# Patient Record
Sex: Female | Born: 2000 | Race: Black or African American | Hispanic: No | Marital: Single | Smoking: Current every day smoker
Health system: Southern US, Community
[De-identification: ages and names within clinical notes are randomized; demographics above are authoritative.]

## PROBLEM LIST (undated history)

## (undated) DIAGNOSIS — K219 Gastro-esophageal reflux disease without esophagitis: Secondary | ICD-10-CM

## (undated) HISTORY — PX: NO PAST SURGERIES: SHX2092

---
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
## 2019-07-31 ENCOUNTER — Other Ambulatory Visit: Payer: Self-pay

## 2019-07-31 ENCOUNTER — Encounter (HOSPITAL_COMMUNITY): Payer: Self-pay | Admitting: Emergency Medicine

## 2019-07-31 ENCOUNTER — Emergency Department (HOSPITAL_COMMUNITY): Payer: Self-pay

## 2019-07-31 ENCOUNTER — Emergency Department (HOSPITAL_COMMUNITY)
Admission: EM | Admit: 2019-07-31 | Discharge: 2019-08-02 | Disposition: A | Discharge status: Home / Self Care | Payer: Self-pay | Attending: Emergency Medicine | Admitting: Emergency Medicine

## 2019-07-31 DIAGNOSIS — Z79899 Other long term (current) drug therapy: Secondary | ICD-10-CM | POA: Insufficient documentation

## 2019-07-31 DIAGNOSIS — F4325 Adjustment disorder with mixed disturbance of emotions and conduct: Secondary | ICD-10-CM | POA: Insufficient documentation

## 2019-07-31 DIAGNOSIS — Z23 Encounter for immunization: Secondary | ICD-10-CM | POA: Insufficient documentation

## 2019-07-31 DIAGNOSIS — F323 Major depressive disorder, single episode, severe with psychotic features: Secondary | ICD-10-CM | POA: Insufficient documentation

## 2019-07-31 DIAGNOSIS — M25571 Pain in right ankle and joints of right foot: Secondary | ICD-10-CM | POA: Insufficient documentation

## 2019-07-31 DIAGNOSIS — M79641 Pain in right hand: Secondary | ICD-10-CM | POA: Insufficient documentation

## 2019-07-31 DIAGNOSIS — F17210 Nicotine dependence, cigarettes, uncomplicated: Secondary | ICD-10-CM | POA: Insufficient documentation

## 2019-07-31 DIAGNOSIS — Z20828 Contact with and (suspected) exposure to other viral communicable diseases: Secondary | ICD-10-CM | POA: Insufficient documentation

## 2019-07-31 LAB — RAPID URINE DRUG SCREEN, HOSP PERFORMED
Amphetamines: NOT DETECTED
Barbiturates: NOT DETECTED
Benzodiazepines: NOT DETECTED
Cocaine: NOT DETECTED
Opiates: NOT DETECTED
Tetrahydrocannabinol: POSITIVE — AB

## 2019-07-31 LAB — COMPREHENSIVE METABOLIC PANEL
ALT: 26 U/L (ref 0–44)
AST: 26 U/L (ref 15–41)
Albumin: 4 g/dL (ref 3.5–5.0)
Alkaline Phosphatase: 81 U/L (ref 38–126)
Anion gap: 8 (ref 5–15)
BUN: 8 mg/dL (ref 6–20)
CO2: 25 mmol/L (ref 22–32)
Calcium: 9.2 mg/dL (ref 8.9–10.3)
Chloride: 105 mmol/L (ref 98–111)
Creatinine, Ser: 0.71 mg/dL (ref 0.44–1.00)
GFR calc Af Amer: >60 mL/min (ref >60)
GFR calc non Af Amer: >60 mL/min (ref >60)
Glucose, Bld: 100 mg/dL — ABNORMAL HIGH (ref 70–99)
Potassium: 4.3 mmol/L (ref 3.5–5.1)
Sodium: 138 mmol/L (ref 135–145)
Total Bilirubin: 0.7 mg/dL (ref 0.3–1.2)
Total Protein: 7.8 g/dL (ref 6.5–8.1)

## 2019-07-31 LAB — ETHANOL: Alcohol, Ethyl (B): <10 mg/dL (ref <10)

## 2019-07-31 LAB — CBC
HCT: 41.5 % (ref 36.0–46.0)
Hemoglobin: 13.3 g/dL (ref 12.0–15.0)
MCH: 29.2 pg (ref 26.0–34.0)
MCHC: 32 g/dL (ref 30.0–36.0)
MCV: 91.2 fL (ref 80.0–100.0)
Platelets: 425 10*3/uL — ABNORMAL HIGH (ref 150–400)
RBC: 4.55 MIL/uL (ref 3.87–5.11)
RDW: 15 % (ref 11.5–15.5)
WBC: 5.8 10*3/uL (ref 4.0–10.5)
nRBC: 0 % (ref 0.0–0.2)

## 2019-07-31 LAB — ACETAMINOPHEN LEVEL: Acetaminophen (Tylenol), Serum: <10 ug/mL — ABNORMAL LOW (ref 10–30)

## 2019-07-31 LAB — I-STAT BETA HCG BLOOD, ED (MC, WL, AP ONLY): I-stat hCG, quantitative: <5 m[IU]/mL (ref <5)

## 2019-07-31 LAB — SARS CORONAVIRUS 2 (TAT 6-24 HRS): SARS Coronavirus 2: NEGATIVE

## 2019-07-31 LAB — SALICYLATE LEVEL: Salicylate Lvl: <7 mg/dL (ref 2.8–30.0)

## 2019-07-31 MED ORDER — ALUM & MAG HYDROXIDE-SIMETH 200-200-20 MG/5ML PO SUSP: 30.0000 mL | Freq: Four times a day (QID) | ORAL | Status: DC | PRN

## 2019-07-31 MED ORDER — ONDANSETRON HCL 4 MG PO TABS: 4.0000 mg | ORAL_TABLET | Freq: Three times a day (TID) | ORAL | Status: DC | PRN

## 2019-07-31 MED ORDER — TETANUS-DIPHTH-ACELL PERTUSSIS 5-2.5-18.5 LF-MCG/0.5 IM SUSP
0.5000 mL | Freq: Once | INTRAMUSCULAR | Status: AC
  Administered 2019-07-31: 0.5 mL via INTRAMUSCULAR
  Filled 2019-07-31: qty 0.5

## 2019-07-31 MED ORDER — NICOTINE 21 MG/24HR TD PT24: 21.0000 mg | MEDICATED_PATCH | Freq: Every day | TRANSDERMAL | Status: DC

## 2019-07-31 MED ORDER — ACETAMINOPHEN 325 MG PO TABS: 650.0000 mg | ORAL_TABLET | ORAL | Status: DC | PRN
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
## 2019-07-31 NOTE — BHH Counselor (Signed)
Patient unable to be seen, patient is in a hall bed. TTS will see patient once she's in a room. Clock has been stopped.

## 2019-07-31 NOTE — ED Provider Notes (Signed)
[facility] EMERGENCY DEPARTMENT Provider Note   CSN: [id number] Arrival date & time: 07/31/19  1248     History   Chief Complaint Chief Complaint  Patient presents with  . Suicidal    HPI Nicole Pratt is a very pleasant 18 y.o. female who was previously healthy who presents with suicidal ideation and hallucinations.  Patient reports this has been going on for "awhile," however she has never been seen for it.  Patient describes suicidal thoughts with a plan to jump off a bridge.  Patient also reports homicidal ideation specifically toward 1 female, whom she did not name.  She cut her L arm yesterday.  She has had auditory and visual hallucinations.  She uses marijuana daily and occasionally drinks alcohol.  She reports some right hand pain after punching a wall yesterday.  She also reports right ankle pain after she landed on it wrong playing football last week.  Otherwise, patient denies any other complaints.  Patient is here voluntarily and states she really wants to get help.  Tetanus not up-to-date.     HPI  History reviewed. No pertinent past medical history.  There are no active problems to display for this patient.   History reviewed. No pertinent surgical history.   OB History   No obstetric history on file.      Home Medications    Prior to Admission medications   Medication Sig Start Date End Date Taking? Authorizing Provider  ibuprofen (ADVIL) 200 MG tablet Take 400 mg by mouth every 6 (six) hours as needed for headache (pain).   Yes [provider]    Family History No family history on file.  Social History Social History   Tobacco Use  . Smoking status: Current Every Day Smoker  . Smokeless tobacco: Never Used  Substance Use Topics  . Alcohol use: Yes  . Drug use: Not Currently     Allergies   Patient has no known allergies.   Review of Systems Review of Systems  Constitutional: Negative for chills and  fever.  HENT: Negative for facial swelling and sore throat.   Respiratory: Negative for shortness of breath.   Cardiovascular: Negative for chest pain.  Gastrointestinal: Negative for abdominal pain, nausea and vomiting.  Genitourinary: Negative for dysuria.  Musculoskeletal: Positive for arthralgias. Negative for back pain.  Skin: Positive for wound. Negative for rash.  Neurological: Negative for headaches.  Psychiatric/Behavioral: Positive for hallucinations and suicidal ideas. The patient is not nervous/anxious.      Physical Exam Updated Vital Signs BP 121/77 (BP Location: Right Arm)   Pulse 88   Temp 98.4 F (36.9 C) (Oral)   Resp 16   LMP 07/25/2019   SpO2 100%   Physical Exam Vitals signs and nursing note reviewed.  Constitutional:      General: She is not in acute distress.    Appearance: She is well-developed. She is not diaphoretic.  HENT:     Head: Normocephalic and atraumatic.     Mouth/Throat:     Pharynx: No oropharyngeal exudate.  Eyes:     General: No scleral icterus.       Right eye: No discharge.        Left eye: No discharge.     Conjunctiva/sclera: Conjunctivae normal.     Pupils: Pupils are equal, round, and reactive to light.  Neck:     Musculoskeletal: Normal range of motion and neck supple.     Thyroid: No thyromegaly.  Cardiovascular:  Rate and Rhythm: Normal rate and regular rhythm.     Heart sounds: Normal heart sounds. No murmur. No friction rub. No gallop.   Pulmonary:     Effort: Pulmonary effort is normal. No respiratory distress.     Breath sounds: Normal breath sounds. No stridor. No wheezing or rales.  Abdominal:     General: Bowel sounds are normal. There is no distension.     Palpations: Abdomen is soft.     Tenderness: There is no abdominal tenderness. There is no guarding or rebound.  Musculoskeletal:     Comments: Tenderness over the right fifth metacarpal, mild swelling, full range of motion and can close fist Tenderness  over the right lateral malleolus with some swelling  Lymphadenopathy:     Cervical: No cervical adenopathy.  Skin:    General: Skin is warm and dry.     Coloration: Skin is not pale.     Findings: No rash.     Comments: Multiple superficial lacerations to the left forearm.  On her left forearm as well is written in marker, "I choose to die" in all capital letters  Neurological:     Mental Status: She is alert.     Coordination: Coordination normal.  Psychiatric:        Attention and Perception: She does not perceive auditory or visual hallucinations.        Mood and Affect: Mood is depressed.        Behavior: Behavior normal. Behavior is cooperative.        Thought Content: Thought content includes homicidal and suicidal ideation. Thought content includes suicidal plan.      ED Treatments / Results  Labs (all labs ordered are listed, but only abnormal results are displayed) Labs Reviewed  COMPREHENSIVE METABOLIC PANEL - Abnormal; Notable for the following components:      Result Value   Glucose, Bld 100 (*)    All other components within normal limits  ACETAMINOPHEN LEVEL - Abnormal; Notable for the following components:   Acetaminophen (Tylenol), Serum <10 (*)    All other components within normal limits  CBC - Abnormal; Notable for the following components:   Platelets 425 (*)    All other components within normal limits  ETHANOL  SALICYLATE LEVEL  RAPID URINE DRUG SCREEN, HOSP PERFORMED  I-STAT BETA HCG BLOOD, ED ([facility], [facility], [facility] ONLY)    EKG None  Radiology Dg Ankle Complete Right  Result Date: 07/31/2019 CLINICAL DATA:  Right ankle pain after landing on it wrong while playing football EXAM: RIGHT ANKLE - COMPLETE 3+ VIEW COMPARISON:  None. FINDINGS: Mild diffuse soft tissue swelling. No fracture or dislocation identified. No radio-opaque foreign body or soft tissue calcification. IMPRESSION: Soft tissue swelling.  No acute bone abnormality. Electronically Signed   By:  Kerby Moors M.D.   On: 07/31/2019 15:08   Dg Hand Complete Right  Result Date: 07/31/2019 CLINICAL DATA:  Right hand pain after punching something yesterday. EXAM: RIGHT HAND - COMPLETE 3+ VIEW COMPARISON:  None. FINDINGS: There is no evidence of fracture or dislocation. There is no evidence of arthropathy or other focal bone abnormality. Soft tissues are unremarkable. IMPRESSION: Negative. Electronically Signed   By: Zerita Boers M.D.   On: 07/31/2019 15:05    Procedures Procedures (including critical care time)  Medications Ordered in ED Medications  Tdap (BOOSTRIX) injection 0.5 mL (has no administration in time range)  acetaminophen (TYLENOL) tablet 650 mg (has no administration in time range)  ondansetron (  ZOFRAN) tablet 4 mg (has no administration in time range)  alum & mag hydroxide-simeth (MAALOX/MYLANTA) 200-200-20 MG/5ML suspension 30 mL (has no administration in time range)  nicotine (NICODERM CQ - dosed in mg/24 hours) patch 21 mg (has no administration in time range)     Initial Impression / Assessment and Plan / ED Course  I have reviewed the triage vital signs and the nursing notes.  Pertinent labs & imaging results that were available during my care of the patient were reviewed by me and considered in my medical decision making (see chart for details).        Patient presenting with suicidal, homicidal ideation as well as auditory and visual hallucinations.  Patient has never been evaluated for this.  She has an active plan of suicide to jump off a bridge with water below.  Patient states she does not know how to swim. Labs are negative and x-rays are negative for fracture.  Tetanus updated Patient is medically cleared.  Disposition pending TTS evaluation.  Patient is here voluntarily, but would IVC if patient tried to leave.  She understands this.  Final Clinical Impressions(s) / ED Diagnoses   Final diagnoses:  Suicidal ideation  Hallucinations  Homicidal  ideation  Right hand pain  Acute right ankle pain    ED Discharge Orders    None       Emi HolesLaw, Johnhenry Tippin M, PA-C 07/31/19 1527    Terrilee FilesButler, Michael C, MD 07/31/19 [phone]

## 2019-07-31 NOTE — ED Notes (Signed)
Security at bedside wanding pt 

## 2019-07-31 NOTE — ED Notes (Signed)
Pt eating dinner - Pt voiced understanding of Medical Clearance Pt Policy form - Copy given to pt.

## 2019-07-31 NOTE — ED Notes (Signed)
ALL belongings - inventoried - Building services engineer # 4 - NO Valuables to [employer].

## 2019-07-31 NOTE — ED Notes (Signed)
TTS being performed.  

## 2019-07-31 NOTE — ED Triage Notes (Signed)
Pt states, "I'm here for mental health problems."  Asked pt if she is suicidal and she states, "I was."  Asked if she had a plan and she said, "Yes, but I don't want to talk about it."  Reports auditory and visual hallucinations.  Pt very reluctant to answer any questions.

## 2019-07-31 NOTE — ED Notes (Signed)
Pt changed into burgundy scrubs, labels bagged/labled and placed in LOCKER #4, and security called to wand pt.

## 2019-07-31 NOTE — ED Notes (Signed)
Will need to re-order TTS when pt has been medically cleared - per TTS.

## 2019-08-01 NOTE — ED Notes (Signed)
Sitter leaving at this time. Staffing Office advised no Environmental health practitioner aware.

## 2019-08-01 NOTE — Progress Notes (Signed)
Patient meets criteria for inpatient treatment per Ricky Ala, NP. No appropriate beds at [facility] currently. CSW faxed referrals to the following facilities for review:  [facility]   [facility]   [facility]   [facility]   [facility]    TTS will continue to seek bed placement.     Darletta Moll MSW, [place] Worker Disposition  [facility] Ph: [phone] Fax: [phone] 08/01/2019 11:19 AM

## 2019-08-01 NOTE — BHH Counselor (Signed)
  REASSESSMENT  [place] reevaluated pt for safety and stability.  Pt was observed sitting on the bed, alert, and oriented x 2.  Pt states, "I'm still sad and hearing voices.  I want to get help."  Pt continues to endorse SI/HI and A-hallucinations.  Pt meet inpatient criteria.  TTS will continue to look for inpatient placement.  Alexandera Kuntzman L. [place], [place], [facility], [facility] Therapeutic Triage Specialist  [phone]

## 2019-08-01 NOTE — ED Notes (Signed)
Pt asked politely to use phone. Called sister.

## 2019-08-01 NOTE — ED Notes (Signed)
Pt on phone at nurses' desk - pt noted to be tearful.

## 2019-08-01 NOTE — Progress Notes (Signed)
Patient meets criteria for inpatient treatment per Ricky Ala, NP. No appropriate beds at [facility] currently. CSW faxed referral to the following facility for review:  [facility]   TTS will continue to seek bed placement.   Darletta Moll MSW, [place] Worker Disposition  [facility] Ph: [phone] Fax: [phone]

## 2019-08-01 NOTE — ED Notes (Signed)
No sitter available at this time. Charge RN aware.  

## 2019-08-01 NOTE — ED Notes (Signed)
Pt ambulatory to bathroom then back to room w/o difficulty.  

## 2019-08-01 NOTE — ED Notes (Signed)
Patient denies pain and is resting comfortably.  

## 2019-08-02 ENCOUNTER — Inpatient Hospital Stay (HOSPITAL_COMMUNITY)
Admission: AD | Admit: 2019-08-02 | Payer: Federal, State, Local not specified - Other | Source: Intra-hospital | Admitting: Psychiatry

## 2019-08-02 DIAGNOSIS — F4325 Adjustment disorder with mixed disturbance of emotions and conduct: Secondary | ICD-10-CM | POA: Diagnosis present

## 2019-08-02 NOTE — ED Notes (Signed)
Breakfast ordered 

## 2019-08-02 NOTE — ED Notes (Signed)
Pt eating breakfast and reports "doing ok"

## 2019-08-02 NOTE — Progress Notes (Signed)
Pt accepted to [facility]; bed 304-1  Dr. Dwyane Dee is the accepting provider.    Dr. Parke Poisson is the attending provider.    Call report to [place] @ [facility] ED notified.     Pt is voluntary and will be transported by [facility].   Pt is scheduled to arrive to [facility] at [place], [place], [place] Disposition CSW [facility]HH/TTS [phone]

## 2019-08-02 NOTE — BH Assessment (Signed)
[place] Assessment Progress Note This Probation officer spoke to patient's sister Milagros Reap [phone] with patient's permission to gather collateral. Trenton Gammon states she has no concerns in reference to patient being discharged. Trenton Gammon states to her knowledge patient has never attempted self harm. Patient will be discharged after CSW consult and encouraged to follow up with OP resources provided.
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
## 2019-08-02 NOTE — Discharge Instructions (Signed)
[facility] ° °Mental health service in [place], [place] °COVID-19 info: [email or web address] °Get online care: [email or web address] °Address: [address], [place], [place] [address] °Hours:  °Open ? Closes 7PM °Phone: [phone] °
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
## 2020-12-05 IMAGING — CR DG ANKLE COMPLETE 3+V*R*
3 series · 3 of 3 positions shown · non-contrast
Comparison: None.

CLINICAL DATA: Right ankle pain after landing on it wrong while
playing football

EXAM:
RIGHT ANKLE - COMPLETE 3+ VIEW

[ankle ap]
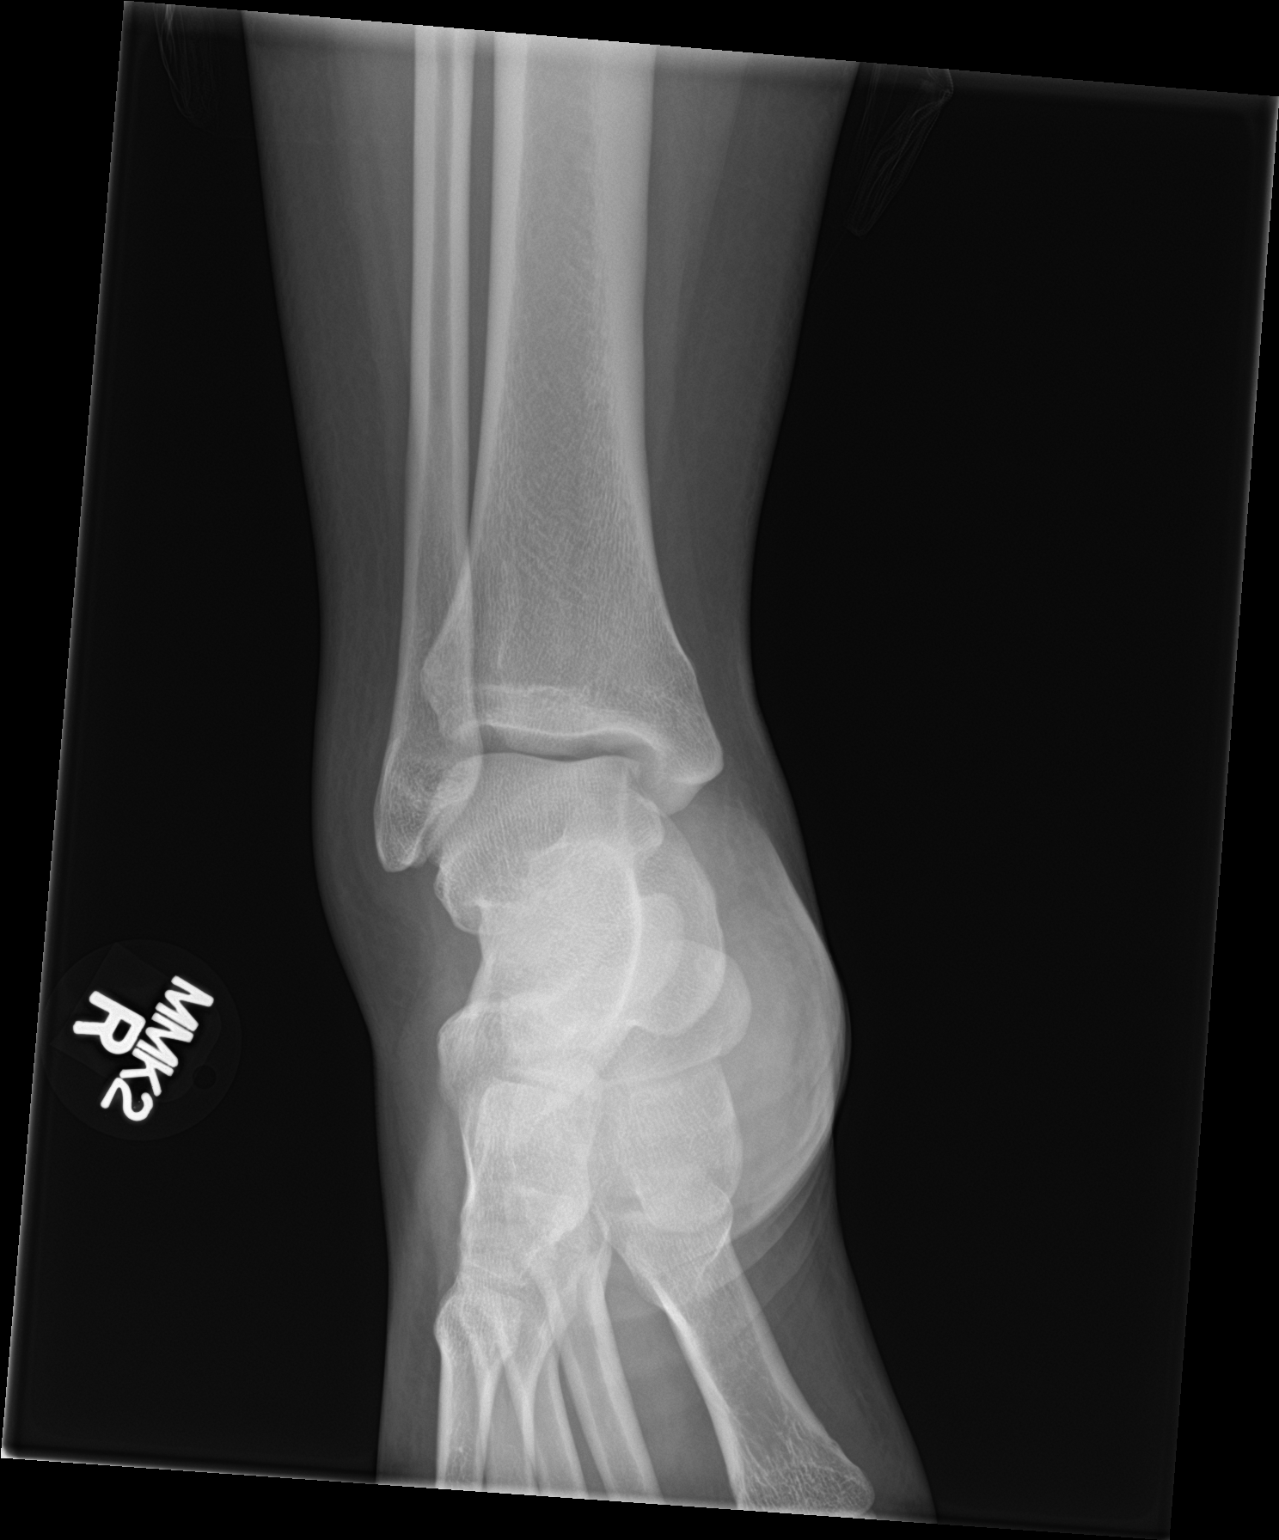

[ankle obl]
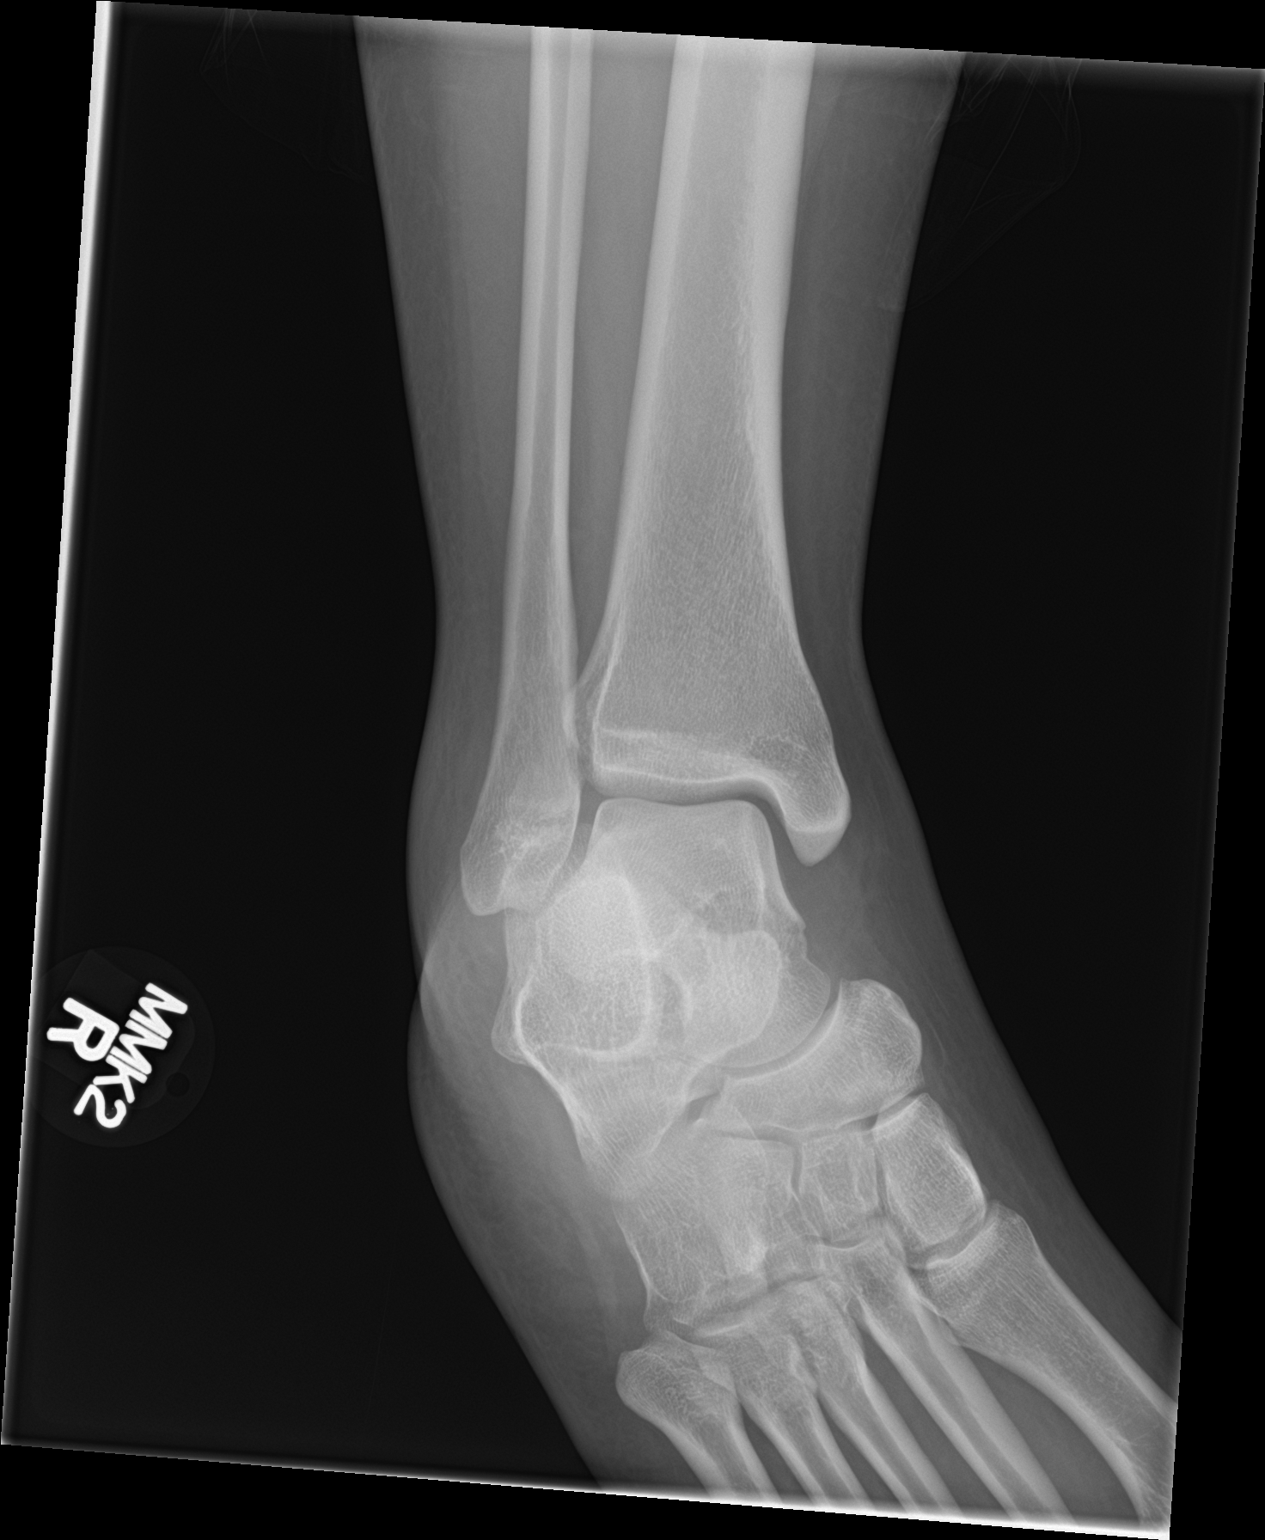

[ankle lat]
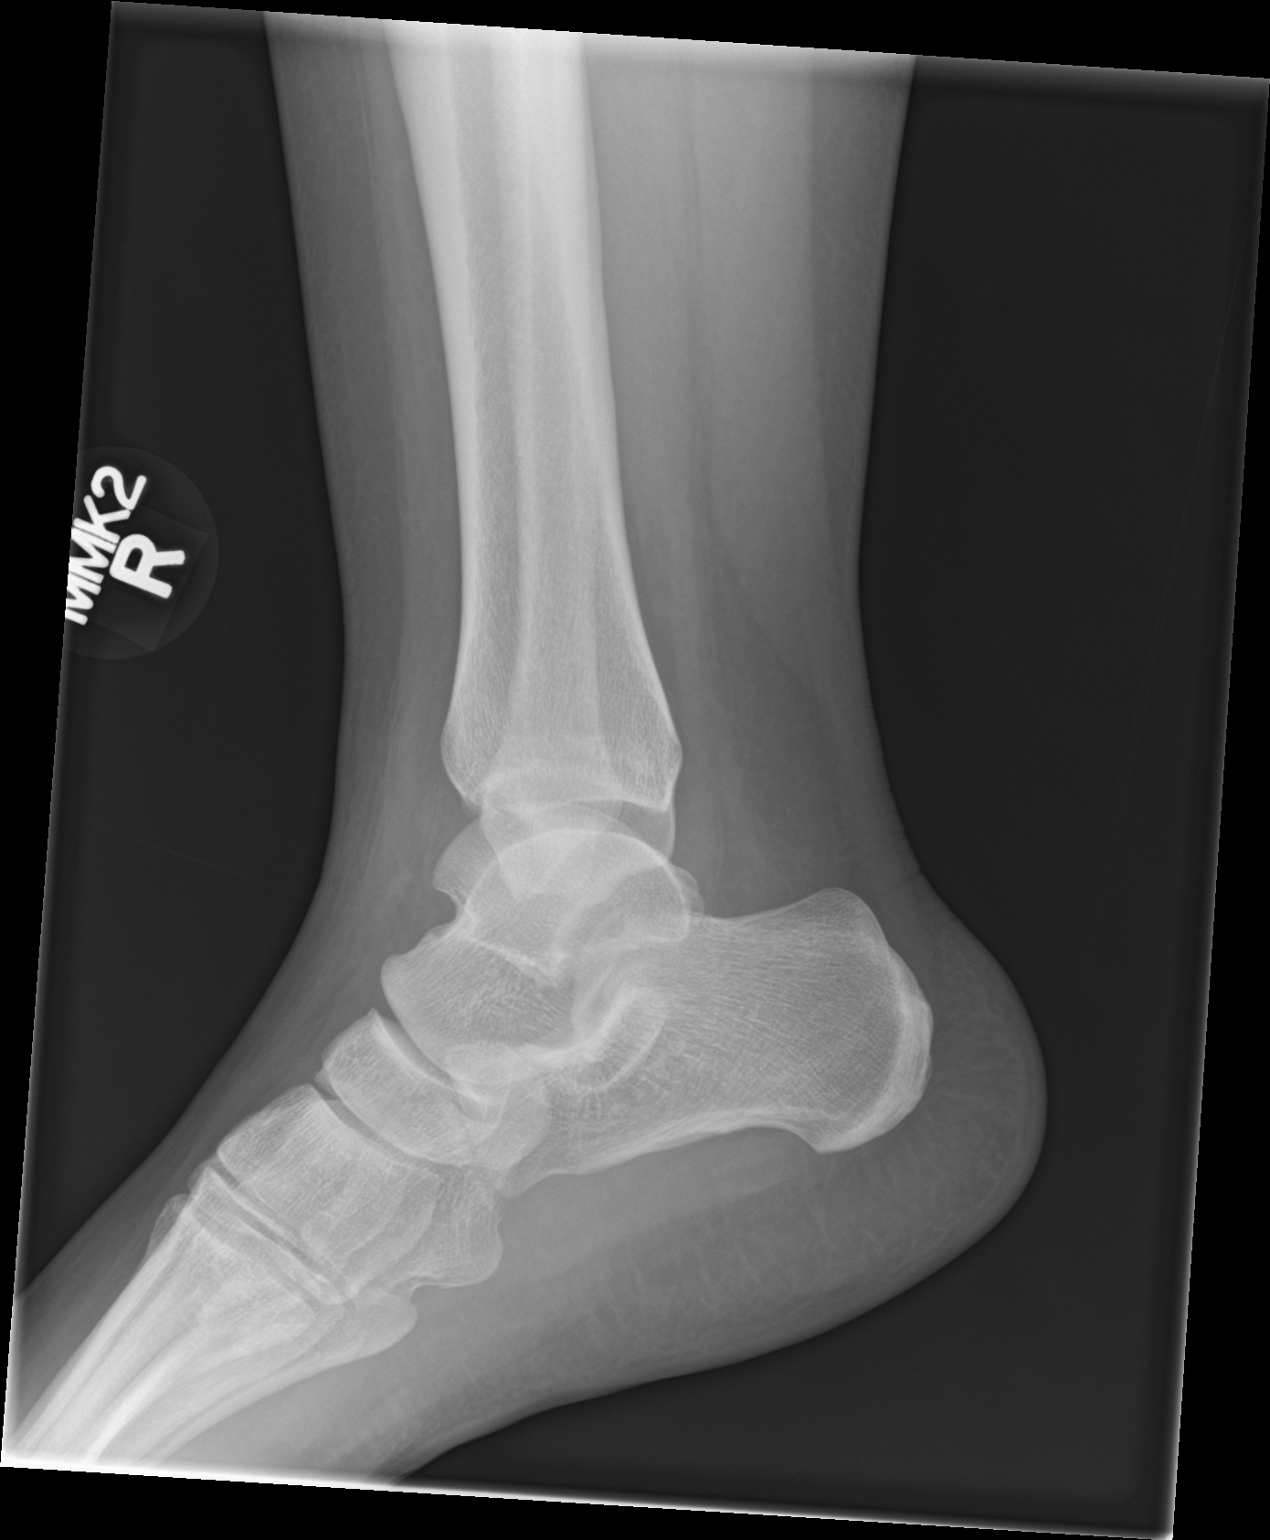

[3 of 3 positions shown; findings below may reference images not displayed]

FINDINGS: Mild diffuse soft tissue swelling. No fracture or dislocation
identified. No radio-opaque foreign body or soft tissue
calcification.
IMPRESSION: Soft tissue swelling.  No acute bone abnormality.

## 2023-02-01 ENCOUNTER — Other Ambulatory Visit: Payer: Self-pay

## 2023-02-01 ENCOUNTER — Emergency Department (HOSPITAL_COMMUNITY): Payer: Self-pay | Admitting: Anesthesiology

## 2023-02-01 ENCOUNTER — Encounter (HOSPITAL_COMMUNITY)
Admission: EM | Disposition: A | Discharge status: Home / Self Care | Payer: Self-pay | Source: Home / Self Care | Attending: Emergency Medicine

## 2023-02-01 ENCOUNTER — Emergency Department (HOSPITAL_COMMUNITY): Payer: Self-pay

## 2023-02-01 ENCOUNTER — Encounter (HOSPITAL_COMMUNITY): Payer: Self-pay | Admitting: Orthopedic Surgery

## 2023-02-01 ENCOUNTER — Ambulatory Visit (HOSPITAL_COMMUNITY)
Admission: EM | Admit: 2023-02-01 | Discharge: 2023-02-01 | Disposition: A | Discharge status: Home / Self Care | Payer: Self-pay | Attending: Emergency Medicine | Admitting: Emergency Medicine

## 2023-02-01 ENCOUNTER — Emergency Department (HOSPITAL_BASED_OUTPATIENT_CLINIC_OR_DEPARTMENT_OTHER): Payer: Self-pay | Admitting: Anesthesiology

## 2023-02-01 DIAGNOSIS — F17210 Nicotine dependence, cigarettes, uncomplicated: Secondary | ICD-10-CM

## 2023-02-01 DIAGNOSIS — S02652A Fracture of angle of left mandible, initial encounter for closed fracture: Secondary | ICD-10-CM

## 2023-02-01 DIAGNOSIS — Y990 Civilian activity done for income or pay: Secondary | ICD-10-CM | POA: Insufficient documentation

## 2023-02-01 DIAGNOSIS — S02602A Fracture of unspecified part of body of left mandible, initial encounter for closed fracture: Secondary | ICD-10-CM

## 2023-02-01 DIAGNOSIS — S0269XA Fracture of mandible of other specified site, initial encounter for closed fracture: Secondary | ICD-10-CM | POA: Insufficient documentation

## 2023-02-01 HISTORY — DX: Gastro-esophageal reflux disease without esophagitis: K21.9

## 2023-02-01 HISTORY — PX: ORIF MANDIBULAR FRACTURE: SHX2127

## 2023-02-01 LAB — CBC
HCT: 40.7 % (ref 36.0–46.0)
Hemoglobin: 13.4 g/dL (ref 12.0–15.0)
MCH: 29.4 pg (ref 26.0–34.0)
MCHC: 32.9 g/dL (ref 30.0–36.0)
MCV: 89.3 fL (ref 80.0–100.0)
Platelets: 405 10*3/uL — ABNORMAL HIGH (ref 150–400)
RBC: 4.56 MIL/uL (ref 3.87–5.11)
RDW: 15.5 % (ref 11.5–15.5)
WBC: 11.3 10*3/uL — ABNORMAL HIGH (ref 4.0–10.5)
nRBC: 0 % (ref 0.0–0.2)

## 2023-02-01 LAB — BASIC METABOLIC PANEL
Anion gap: 10 (ref 5–15)
BUN: 8 mg/dL (ref 6–20)
CO2: 22 mmol/L (ref 22–32)
Calcium: 8.7 mg/dL — ABNORMAL LOW (ref 8.9–10.3)
Chloride: 106 mmol/L (ref 98–111)
Creatinine, Ser: 0.61 mg/dL (ref 0.44–1.00)
GFR, Estimated: >60 mL/min (ref >60)
Glucose, Bld: 111 mg/dL — ABNORMAL HIGH (ref 70–99)
Potassium: 3.8 mmol/L (ref 3.5–5.1)
Sodium: 138 mmol/L (ref 135–145)

## 2023-02-01 LAB — POCT PREGNANCY, URINE: Preg Test, Ur: NEGATIVE

## 2023-02-01 SURGERY — OPEN REDUCTION INTERNAL FIXATION (ORIF) MANDIBULAR FRACTURE
Anesthesia: General | Site: Mouth

## 2023-02-01 MED ORDER — FENTANYL CITRATE (PF) 250 MCG/5ML IJ SOLN
INTRAMUSCULAR | Status: AC
  Filled 2023-02-01: qty 5

## 2023-02-01 MED ORDER — DEXAMETHASONE SODIUM PHOSPHATE 10 MG/ML IJ SOLN
INTRAMUSCULAR | Status: DC | PRN
  Administered 2023-02-01: 10 mg via INTRAVENOUS

## 2023-02-01 MED ORDER — ORAL CARE MOUTH RINSE: 15.0000 mL | Freq: Once | OROMUCOSAL | Status: DC

## 2023-02-01 MED ORDER — ACETAMINOPHEN 160 MG/5ML PO SOLN: 325.0000 mg | ORAL | Status: DC | PRN

## 2023-02-01 MED ORDER — OXYCODONE HCL 5 MG PO TABS: 5.0000 mg | ORAL_TABLET | Freq: Once | ORAL | Status: DC | PRN

## 2023-02-01 MED ORDER — ROCURONIUM BROMIDE 10 MG/ML (PF) SYRINGE
PREFILLED_SYRINGE | INTRAVENOUS | Status: DC | PRN
  Administered 2023-02-01: 70 mg via INTRAVENOUS

## 2023-02-01 MED ORDER — FENTANYL CITRATE (PF) 100 MCG/2ML IJ SOLN
25.0000 ug | INTRAMUSCULAR | Status: DC | PRN
  Administered 2023-02-01: 50 ug via INTRAVENOUS

## 2023-02-01 MED ORDER — MORPHINE SULFATE (PF) 4 MG/ML IV SOLN
4.0000 mg | Freq: Once | INTRAVENOUS | Status: AC
  Administered 2023-02-01: 4 mg via INTRAVENOUS
  Filled 2023-02-01: qty 1

## 2023-02-01 MED ORDER — FENTANYL CITRATE (PF) 250 MCG/5ML IJ SOLN
INTRAMUSCULAR | Status: DC | PRN
  Administered 2023-02-01: 25 ug via INTRAVENOUS
  Administered 2023-02-01: 75 ug via INTRAVENOUS

## 2023-02-01 MED ORDER — SUGAMMADEX SODIUM 200 MG/2ML IV SOLN
INTRAVENOUS | Status: DC | PRN
  Administered 2023-02-01: 200 mg via INTRAVENOUS

## 2023-02-01 MED ORDER — DEXMEDETOMIDINE HCL IN NACL 80 MCG/20ML IV SOLN
INTRAVENOUS | Status: DC | PRN
  Administered 2023-02-01 (×2): 10 ug via INTRAVENOUS

## 2023-02-01 MED ORDER — ACETAMINOPHEN 325 MG PO TABS: 325.0000 mg | ORAL_TABLET | ORAL | Status: DC | PRN

## 2023-02-01 MED ORDER — ONDANSETRON HCL 4 MG/2ML IJ SOLN
INTRAMUSCULAR | Status: AC
  Filled 2023-02-01: qty 2

## 2023-02-01 MED ORDER — MIDAZOLAM HCL 2 MG/2ML IJ SOLN
INTRAMUSCULAR | Status: DC | PRN
  Administered 2023-02-01: 2 mg via INTRAVENOUS

## 2023-02-01 MED ORDER — KETOROLAC TROMETHAMINE 30 MG/ML IJ SOLN
INTRAMUSCULAR | Status: DC | PRN
  Administered 2023-02-01: 30 mg via INTRAVENOUS

## 2023-02-01 MED ORDER — 0.9 % SODIUM CHLORIDE (POUR BTL) OPTIME
TOPICAL | Status: DC | PRN
  Administered 2023-02-01: 1000 mL

## 2023-02-01 MED ORDER — LIDOCAINE 2% (20 MG/ML) 5 ML SYRINGE
INTRAMUSCULAR | Status: AC
  Filled 2023-02-01: qty 5

## 2023-02-01 MED ORDER — CHLORHEXIDINE GLUCONATE 0.12 % MT SOLN: 15.0000 mL | Freq: Once | OROMUCOSAL | Status: DC

## 2023-02-01 MED ORDER — AMISULPRIDE (ANTIEMETIC) 5 MG/2ML IV SOLN: 10.0000 mg | Freq: Once | INTRAVENOUS | Status: DC | PRN

## 2023-02-01 MED ORDER — FENTANYL CITRATE (PF) 100 MCG/2ML IJ SOLN
INTRAMUSCULAR | Status: AC
  Filled 2023-02-01: qty 2

## 2023-02-01 MED ORDER — ACETAMINOPHEN 10 MG/ML IV SOLN
INTRAVENOUS | Status: AC
  Filled 2023-02-01: qty 100

## 2023-02-01 MED ORDER — LIDOCAINE-EPINEPHRINE 1 %-1:100000 IJ SOLN
INTRAMUSCULAR | Status: AC
  Filled 2023-02-01: qty 1

## 2023-02-01 MED ORDER — LACTATED RINGERS IV SOLN: INTRAVENOUS | Status: DC

## 2023-02-01 MED ORDER — ACETAMINOPHEN 10 MG/ML IV SOLN: 1000.0000 mg | Freq: Once | INTRAVENOUS | Status: DC | PRN

## 2023-02-01 MED ORDER — CEPHALEXIN 500 MG PO CAPS: 500.0000 mg | ORAL_CAPSULE | Freq: Three times a day (TID) | ORAL | 0 refills | Status: AC

## 2023-02-01 MED ORDER — OXYCODONE HCL 5 MG/5ML PO SOLN: 5.0000 mg | Freq: Once | ORAL | Status: DC | PRN

## 2023-02-01 MED ORDER — DEXAMETHASONE SODIUM PHOSPHATE 10 MG/ML IJ SOLN
INTRAMUSCULAR | Status: AC
  Filled 2023-02-01: qty 1

## 2023-02-01 MED ORDER — PROPOFOL 10 MG/ML IV BOLUS
INTRAVENOUS | Status: DC | PRN
  Administered 2023-02-01: 150 mg via INTRAVENOUS

## 2023-02-01 MED ORDER — ACETAMINOPHEN 10 MG/ML IV SOLN
INTRAVENOUS | Status: DC | PRN
  Administered 2023-02-01: 1000 mg via INTRAVENOUS

## 2023-02-01 MED ORDER — MIDAZOLAM HCL 2 MG/2ML IJ SOLN
INTRAMUSCULAR | Status: AC
  Filled 2023-02-01: qty 2

## 2023-02-01 MED ORDER — OXYMETAZOLINE HCL 0.05 % NA SOLN
NASAL | Status: DC | PRN
  Administered 2023-02-01: 2 via NASAL

## 2023-02-01 MED ORDER — PROMETHAZINE HCL 25 MG/ML IJ SOLN: 6.2500 mg | INTRAMUSCULAR | Status: DC | PRN

## 2023-02-01 MED ORDER — ONDANSETRON HCL 4 MG/2ML IJ SOLN
INTRAMUSCULAR | Status: DC | PRN
  Administered 2023-02-01: 4 mg via INTRAVENOUS

## 2023-02-01 MED ORDER — LIDOCAINE 2% (20 MG/ML) 5 ML SYRINGE
INTRAMUSCULAR | Status: DC | PRN
  Administered 2023-02-01: 40 mg via INTRAVENOUS

## 2023-02-01 MED ORDER — ROCURONIUM BROMIDE 10 MG/ML (PF) SYRINGE
PREFILLED_SYRINGE | INTRAVENOUS | Status: AC
  Filled 2023-02-01: qty 10

## 2023-02-01 MED ORDER — HYDROCODONE-ACETAMINOPHEN 7.5-325 MG/15ML PO SOLN: 15.0000 mL | Freq: Four times a day (QID) | ORAL | 0 refills | Status: DC | PRN

## 2023-02-01 SURGICAL SUPPLY — 44 items
BAG COUNTER SPONGE SURGICOUNT (BAG) ×1 IMPLANT
BAR FIX PREFORMED OMNIMAX (Miscellaneous) IMPLANT
BLADE MANDIBULAR SCREWDRIVER (BLADE) IMPLANT
BLADE SURG 10 STRL SS (BLADE) IMPLANT
BLADE SURG 15 STRL LF DISP TIS (BLADE) IMPLANT
BLADE SURG 15 STRL SS (BLADE) ×1
CANISTER SUCT 3000ML PPV (MISCELLANEOUS) ×1 IMPLANT
CLEANER TIP ELECTROSURG 2X2 (MISCELLANEOUS) ×1 IMPLANT
COVER SURGICAL LIGHT HANDLE (MISCELLANEOUS) ×2 IMPLANT
DRAPE HALF SHEET 40X57 (DRAPES) IMPLANT
DRIVER SURG ZDRIVE HIGH TORQUE (ORTHOPEDIC DISPOSABLE SUPPLIES) IMPLANT
ELECT COATED BLADE 2.86 ST (ELECTRODE) IMPLANT
ELECT NDL BLADE 2-5/6 (NEEDLE) IMPLANT
ELECT NEEDLE BLADE 2-5/6 (NEEDLE) IMPLANT
ELECT REM PT RETURN 9FT ADLT (ELECTROSURGICAL) ×1
ELECTRODE REM PT RTRN 9FT ADLT (ELECTROSURGICAL) ×1 IMPLANT
GLOVE ECLIPSE 7.5 STRL STRAW (GLOVE) ×1 IMPLANT
GOWN STRL REUS W/ TWL LRG LVL3 (GOWN DISPOSABLE) ×2 IMPLANT
GOWN STRL REUS W/TWL LRG LVL3 (GOWN DISPOSABLE) ×2
KIT BASIN OR (CUSTOM PROCEDURE TRAY) ×1 IMPLANT
KIT TURNOVER KIT B (KITS) ×1 IMPLANT
NDL HYPO 25GX1X1/2 BEV (NEEDLE) IMPLANT
NEEDLE HYPO 25GX1X1/2 BEV (NEEDLE) ×1 IMPLANT
NS IRRIG 1000ML POUR BTL (IV SOLUTION) ×1 IMPLANT
PAD ARMBOARD 7.5X6 YLW CONV (MISCELLANEOUS) ×2 IMPLANT
PATTIES SURGICAL .5 X3 (DISPOSABLE) IMPLANT
PENCIL FOOT CONTROL (ELECTRODE) ×1 IMPLANT
POSITIONER HEAD DONUT 9IN (MISCELLANEOUS) IMPLANT
PROTECTOR CORNEAL (OPHTHALMIC RELATED) IMPLANT
SCISSORS WIRE ANG 4 3/4 DISP (INSTRUMENTS) IMPLANT
SCREW BONE 2X7 CROSS DRIVE (Screw) IMPLANT
SCREW BONE MANDIB SD 2X9 (Screw) IMPLANT
SPIKE FLUID TRANSFER (MISCELLANEOUS) ×1 IMPLANT
SUT CHROMIC 3 0 PS 2 (SUTURE) ×1 IMPLANT
SUT MNCRL AB 4-0 PS2 18 (SUTURE) ×1 IMPLANT
SUT NYLON ETHILON 5-0 P-3 1X18 (SUTURE) ×1 IMPLANT
SUT SILK 2 0 PERMA HAND 18 BK (SUTURE) IMPLANT
SUT STEEL 0 (SUTURE)
SUT STEEL 0 18XMFL TIE 17 (SUTURE) IMPLANT
SUT STEEL 4 (SUTURE) ×1 IMPLANT
TOWEL GREEN STERILE FF (TOWEL DISPOSABLE) ×1 IMPLANT
TRAY ENT MC OR (CUSTOM PROCEDURE TRAY) ×1 IMPLANT
WATER STERILE IRR 1000ML POUR (IV SOLUTION) ×1 IMPLANT
WIRE 24 GAUGE OMINIMAX MMF (WIRE) IMPLANT
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
## 2023-02-01 NOTE — ED Notes (Signed)
[facility] called for transport : ED TO ED TRANSFER.
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
## 2023-02-01 NOTE — Anesthesia Preprocedure Evaluation (Addendum)
Anesthesia Evaluation  Patient identified by MRN, date of birth, ID band Patient awake    Reviewed: Allergy & Precautions, Patient's Chart, lab work & pertinent test results  Airway Mallampati: Unable to assess       Dental no notable dental hx.    Pulmonary Current Smoker   Pulmonary exam normal        Cardiovascular negative cardio ROS  Rhythm:Regular Rate:Normal     Neuro/Psych  PSYCHIATRIC DISORDERS      negative neurological ROS     GI/Hepatic negative GI ROS, Neg liver ROS,,,  Endo/Other    Renal/GU negative Renal ROS     Musculoskeletal   Abdominal   Peds  Hematology   Anesthesia Other Findings   Reproductive/Obstetrics                             Anesthesia Physical Anesthesia Plan  ASA: 1 and emergent  Anesthesia Plan: General   Post-op Pain Management: Ofirmev IV (intra-op)* and Toradol IV (intra-op)*   Induction: Intravenous  PONV Risk Score and Plan: 4 or greater and Ondansetron, Dexamethasone, Midazolam and Scopolamine patch - Pre-op  Airway Management Planned: Nasal ETT  Additional Equipment: None  Intra-op Plan:   Post-operative Plan: Extubation in OR  Informed Consent: I have reviewed the patients History and Physical, chart, labs and discussed the procedure including the risks, benefits and alternatives for the proposed anesthesia with the patient or authorized representative who has indicated his/her understanding and acceptance.       Plan Discussed with: CRNA  Anesthesia Plan Comments:        Anesthesia Quick Evaluation

## 2023-02-01 NOTE — Anesthesia Procedure Notes (Signed)
Procedure Name: Intubation Date/Time: 02/01/2023 4:35 PM  Performed by: Randon Goldsmith, CRNAPre-anesthesia Checklist: Patient identified, Emergency Drugs available, Suction available and Patient being monitored Patient Re-evaluated:Patient Re-evaluated prior to induction Oxygen Delivery Method: Circle system utilized Preoxygenation: Pre-oxygenation with 100% oxygen Induction Type: IV induction Ventilation: Mask ventilation without difficulty Laryngoscope Size: Glidescope and 3 Grade View: Grade I Nasal Tubes: Right, Nasal Rae and Nasal prep performed Tube size: 7.0 mm Number of attempts: 1 Airway Equipment and Method: Stylet and Oral airway Placement Confirmation: ETT inserted through vocal cords under direct vision, positive ETCO2 and breath sounds checked- equal and bilateral Tube secured with: Tape Dental Injury: Teeth and Oropharynx as per pre-operative assessment
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
## 2023-02-01 NOTE — Transfer of Care (Signed)
Immediate Anesthesia Transfer of Care Note  Patient: Nicole Pratt  Procedure(s) Performed: MMF (Mouth)  Patient Location: PACU  Anesthesia Type:General  Level of Consciousness: drowsy  Airway & Oxygen Therapy: Patient Spontanous Breathing  Post-op Assessment: Report given to RN and Post -op Vital signs reviewed and stable  Post vital signs: Reviewed and stable  Last Vitals:  Vitals Value Taken Time  BP 131/97   Temp    Pulse 84 02/01/23 1714  Resp 18   SpO2 96 % 02/01/23 1714  Vitals shown include unvalidated device data.  Last Pain:  Vitals:   02/01/23 1524  TempSrc: Oral  PainSc: 4          Complications: No notable events documented.
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
## 2023-02-02 NOTE — Anesthesia Postprocedure Evaluation (Signed)
Anesthesia Post Note  Patient: Nicole Pratt  Procedure(s) Performed: MMF (Mouth)     Patient location during evaluation: PACU Anesthesia Type: General Level of consciousness: awake and alert Pain management: pain level controlled Vital Signs Assessment: post-procedure vital signs reviewed and stable Respiratory status: spontaneous breathing, nonlabored ventilation, respiratory function stable and patient connected to nasal cannula oxygen Cardiovascular status: blood pressure returned to baseline and stable Postop Assessment: no apparent nausea or vomiting Anesthetic complications: no  No notable events documented.  Last Vitals:  Vitals:   02/01/23 1800 02/01/23 1815  BP: (!) 127/96 (!) 134/91  Pulse: 69 77  Resp: 11 16  Temp:  (!) 36.2 C  SpO2: 95% 97%    Last Pain:  Vitals:   02/01/23 1815  TempSrc:   PainSc: Asleep                 Shelton Silvas

## 2023-02-04 ENCOUNTER — Other Ambulatory Visit (INDEPENDENT_AMBULATORY_CARE_PROVIDER_SITE_OTHER): Payer: Self-pay | Admitting: Otolaryngology

## 2023-02-04 MED ORDER — HYDROCODONE-ACETAMINOPHEN 7.5-325 MG/15ML PO SOLN: 15.0000 mL | Freq: Four times a day (QID) | ORAL | 0 refills | Status: DC | PRN

## 2023-02-04 NOTE — Progress Notes (Unsigned)
Called in pain med to different pharmacy in [place] at their request

## 2023-02-05 ENCOUNTER — Encounter (HOSPITAL_COMMUNITY): Payer: Self-pay | Admitting: Otolaryngology

## 2023-02-06 ENCOUNTER — Other Ambulatory Visit: Payer: Self-pay

## 2023-02-06 ENCOUNTER — Emergency Department (HOSPITAL_COMMUNITY)
Admission: EM | Admit: 2023-02-06 | Discharge: 2023-02-07 | Disposition: A | Discharge status: Home / Self Care | Payer: Medicaid Other | Attending: Emergency Medicine | Admitting: Emergency Medicine

## 2023-02-06 ENCOUNTER — Encounter (HOSPITAL_COMMUNITY): Payer: Self-pay

## 2023-02-06 DIAGNOSIS — R55 Syncope and collapse: Secondary | ICD-10-CM | POA: Insufficient documentation

## 2023-02-06 DIAGNOSIS — E860 Dehydration: Secondary | ICD-10-CM | POA: Insufficient documentation

## 2023-02-06 LAB — CBC
HCT: 36.6 % (ref 36.0–46.0)
Hemoglobin: 12 g/dL (ref 12.0–15.0)
MCH: 29.3 pg (ref 26.0–34.0)
MCHC: 32.8 g/dL (ref 30.0–36.0)
MCV: 89.3 fL (ref 80.0–100.0)
Platelets: 448 10*3/uL — ABNORMAL HIGH (ref 150–400)
RBC: 4.1 MIL/uL (ref 3.87–5.11)
RDW: 15 % (ref 11.5–15.5)
WBC: 7.3 10*3/uL (ref 4.0–10.5)
nRBC: 0 % (ref 0.0–0.2)

## 2023-02-06 LAB — CBG MONITORING, ED: Glucose-Capillary: 100 mg/dL — ABNORMAL HIGH (ref 70–99)

## 2023-02-06 MED ORDER — SODIUM CHLORIDE 0.9 % IV BOLUS (SEPSIS)
1000.0000 mL | Freq: Once | INTRAVENOUS | Status: AC
  Administered 2023-02-06: 1000 mL via INTRAVENOUS

## 2023-02-06 MED ORDER — ONDANSETRON HCL 4 MG/2ML IJ SOLN
4.0000 mg | Freq: Once | INTRAMUSCULAR | Status: AC
  Administered 2023-02-06: 4 mg via INTRAVENOUS
  Filled 2023-02-06: qty 2

## 2023-02-06 NOTE — ED Triage Notes (Signed)
Patient's girlfriend reports patient has been dizzy for the last few hours and nauseous. Has wires in her mouth and unable to speak due to broken jaw. Girlfriend states was told by dr if she felt like she was going to vomit to cut the wires, states she cut two wires and stopped due to patient being in pain. Patient communicating via notes on phone, states she has been having ringing in her ears as well and near syncope.
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
## 2023-02-07 ENCOUNTER — Ambulatory Visit (INDEPENDENT_AMBULATORY_CARE_PROVIDER_SITE_OTHER): Payer: Self-pay | Admitting: Otolaryngology

## 2023-02-07 ENCOUNTER — Encounter (INDEPENDENT_AMBULATORY_CARE_PROVIDER_SITE_OTHER): Payer: Self-pay | Admitting: Otolaryngology

## 2023-02-07 VITALS — BP 125/87

## 2023-02-07 DIAGNOSIS — S02652D Fracture of angle of left mandible, subsequent encounter for fracture with routine healing: Secondary | ICD-10-CM

## 2023-02-07 LAB — URINALYSIS, ROUTINE W REFLEX MICROSCOPIC
Bilirubin Urine: NEGATIVE
Glucose, UA: NEGATIVE mg/dL
Hgb urine dipstick: NEGATIVE
Ketones, ur: 20 mg/dL — AB
Nitrite: NEGATIVE
Protein, ur: 30 mg/dL — AB
Specific Gravity, Urine: 1.019 (ref 1.005–1.030)
pH: 5 (ref 5.0–8.0)

## 2023-02-07 LAB — BASIC METABOLIC PANEL
Anion gap: 7 (ref 5–15)
BUN: 7 mg/dL (ref 6–20)
CO2: 25 mmol/L (ref 22–32)
Calcium: 8.7 mg/dL — ABNORMAL LOW (ref 8.9–10.3)
Chloride: 103 mmol/L (ref 98–111)
Creatinine, Ser: 0.76 mg/dL (ref 0.44–1.00)
GFR, Estimated: >60 mL/min (ref >60)
Glucose, Bld: 108 mg/dL — ABNORMAL HIGH (ref 70–99)
Potassium: 3.7 mmol/L (ref 3.5–5.1)
Sodium: 135 mmol/L (ref 135–145)

## 2023-02-07 LAB — I-STAT BETA HCG BLOOD, ED (MC, WL, AP ONLY): I-stat hCG, quantitative: <5 m[IU]/mL (ref <5)

## 2023-02-07 MED ORDER — SODIUM CHLORIDE 0.9 % IV BOLUS (SEPSIS)
1000.0000 mL | Freq: Once | INTRAVENOUS | Status: AC
  Administered 2023-02-07: 1000 mL via INTRAVENOUS

## 2023-02-07 NOTE — Progress Notes (Signed)
Nicole Pratt is an 22 y.o. female.   Chief Complaint: mandible fx HPI: hx of MMF and she began feeling ill. She cut the 2 front wires. Mouth still shut and tight.   Past Medical History:  Diagnosis Date   GERD (gastroesophageal reflux disease)     Past Surgical History:  Procedure Laterality Date   NO PAST SURGERIES     ORIF MANDIBULAR FRACTURE N/A 02/01/2023   Procedure: MMF;  Surgeon: Suzanna Obey, MD;  Location: [facility] OR;  Service: ENT;  Laterality: N/A;    No family history on file. Social History:  reports that she has been smoking cigarettes. She has been smoking an average of .5 packs per day. She has never used smokeless tobacco. She reports current alcohol use.  Drug: Marijuana.  Allergies: No Known Allergies  (Not in a hospital admission)   Results for orders placed or performed during the hospital encounter of 02/06/23 (from the past 48 hour(s))  CBG monitoring, ED     Status: Abnormal   Collection Time: 02/06/23 11:14 PM  Result Value Ref Range   Glucose-Capillary 100 (H) 70 - 99 mg/dL    Comment: Glucose reference range applies only to samples taken after fasting for at least 8 hours.  Basic metabolic panel     Status: Abnormal   Collection Time: 02/06/23 11:41 PM  Result Value Ref Range   Sodium 135 135 - 145 mmol/L   Potassium 3.7 3.5 - 5.1 mmol/L   Chloride 103 98 - 111 mmol/L   CO2 25 22 - 32 mmol/L   Glucose, Bld 108 (H) 70 - 99 mg/dL    Comment: Glucose reference range applies only to samples taken after fasting for at least 8 hours.   BUN 7 6 - 20 mg/dL   Creatinine, Ser 1.61 0.44 - 1.00 mg/dL   Calcium 8.7 (L) 8.9 - 10.3 mg/dL   GFR, Estimated >09 >60 mL/min    Comment: (NOTE) Calculated using the CKD-EPI Creatinine Equation (2021)    Anion gap 7 5 - 15    Comment: Performed at [facility], [address], [place], [place] [address]  CBC     Status: Abnormal   Collection Time: 02/06/23 11:41 PM  Result Value Ref Range   WBC  7.3 4.0 - 10.5 K/uL   RBC 4.10 3.87 - 5.11 MIL/uL   Hemoglobin 12.0 12.0 - 15.0 g/dL   HCT 81.1 91.4 - 78.2 %   MCV 89.3 80.0 - 100.0 fL   MCH 29.3 26.0 - 34.0 pg   MCHC 32.8 30.0 - 36.0 g/dL   RDW 95.6 21.3 - 08.6 %   Platelets 448 (H) 150 - 400 K/uL   nRBC 0.0 0.0 - 0.2 %    Comment: Performed at [facility], [address] [place], [place] [address]  I-Stat beta hCG blood, ED     Status: None   Collection Time: 02/06/23 11:53 PM  Result Value Ref Range   I-stat hCG, quantitative <5.0 <5 mIU/mL   Comment 3            Comment:   GEST. AGE      CONC.  (mIU/mL)   <=1 WEEK        5 - 50     2 WEEKS       50 - 500     3 WEEKS       100 - 10,000     4 WEEKS  1,000 - 30,000        FEMALE AND NON-PREGNANT FEMALE:     LESS THAN 5 mIU/mL   Urinalysis, Routine w reflex microscopic -Urine, Clean Catch     Status: Abnormal   Collection Time: 02/07/23  1:20 AM  Result Value Ref Range   Color, Urine YELLOW YELLOW   APPearance HAZY (A) CLEAR   Specific Gravity, Urine 1.019 1.005 - 1.030   pH 5.0 5.0 - 8.0   Glucose, UA NEGATIVE NEGATIVE mg/dL   Hgb urine dipstick NEGATIVE NEGATIVE   Bilirubin Urine NEGATIVE NEGATIVE   Ketones, ur 20 (A) NEGATIVE mg/dL   Protein, ur 30 (A) NEGATIVE mg/dL   Nitrite NEGATIVE NEGATIVE   Leukocytes,Ua LARGE (A) NEGATIVE   RBC / HPF 0-5 0 - 5 RBC/hpf   WBC, UA 11-20 0 - 5 WBC/hpf   Bacteria, UA RARE (A) NONE SEEN   Squamous Epithelial / HPF 0-5 0 - 5 /HPF   Mucus PRESENT     Comment: Performed at [facility], [address] [place], [place] [address]   No results found.   Last menstrual period 01/31/2023.  PHYSICAL EXAM: Appearance _ awake alert with no distress.  Head- atraumatic and no obvious abnormalities Eyes- PERRL, EOMI, no conjunctiva injection or ecchymosis Ears-  Right- Pinna without inflammation or swelling. canal without obstruction or injury. TM within normal limits  Left- Pinna without  inflammation or swelling. canal without obstruction or injury. TM within normal limits Nose- no lesions or masses.  Oc/OP- 2 loose wires removed. The 2 posterior are tight and occlusion looks good. No significant swelling. no lesions or excessive swelling. Mouth opening normal.  Hp/Larynx- normal voice and no airway issues. No swelling or lesions Neck- no mass or lesions. Normal movement.  Neuro- CNII-XII intact, no sensory deficits.  Lungs- normal effort no distress noted    Assessment/Plan Mandibel fx- she has two wires still in place and she does not want me to replace the two that were cut. She will continue liquid diet and follow up in two weeks. She will stop abx tomorrow.   Suzanna Obey 02/07/2023, 1:59 PM

## 2023-02-07 NOTE — ED Notes (Signed)
Pt ambulated to the bathroom without assistance. On the way back pt states "my legs are getting tingly" and wanted a wheelchair back to the room.

## 2023-02-24 ENCOUNTER — Ambulatory Visit (INDEPENDENT_AMBULATORY_CARE_PROVIDER_SITE_OTHER): Payer: Self-pay | Admitting: Otolaryngology

## 2023-02-24 DIAGNOSIS — S02652D Fracture of angle of left mandible, subsequent encounter for fracture with routine healing: Secondary | ICD-10-CM

## 2023-02-24 NOTE — Progress Notes (Signed)
Nicole Pratt is an 22 y.o. female.   Chief Complaint: hx of mandible fracture HPI: she is doing well with no pain and wires still tight. Occlusion feels correct. No swelling  Past Medical History:  Diagnosis Date   GERD (gastroesophageal reflux disease)     Past Surgical History:  Procedure Laterality Date   NO PAST SURGERIES     ORIF MANDIBULAR FRACTURE N/A 02/01/2023   Procedure: MMF;  Surgeon: Suzanna Obey, MD;  Location: [facility] OR;  Service: ENT;  Laterality: N/A;    No family history on file. Social History:  reports that she has been smoking cigarettes. She has been smoking an average of .5 packs per day. She has never used smokeless tobacco. She reports current alcohol use.  Drug: Marijuana.  Allergies: No Known Allergies  (Not in a hospital admission)   No results found for this or any previous visit (from the past 48 hour(s)). No results found.   Last menstrual period 01/31/2023.  PHYSICAL EXAM: Appearance _ awake alert with no distress.  Head- atraumatic and no obvious abnormalities Eyes- PERRL, EOMI, no conjunctiva injection or ecchymosis Ears-  Right- Pinna without inflammation or swelling. canal without obstruction or injury. TM within normal limits  Left- Pinna without inflammation or swelling. canal without obstruction or injury. TM within normal limits Nose- no lesions or masses.  Oc/OP- 2 wires are still in place and occlusion looks good. No swelling and no pain on plapation no lesions or excessive swelling. Mouth opening normal.  Hp/Larynx- normal voice and no airway issues. No swelling or lesions Neck- no mass or lesions. Normal movement.  Neuro- CNII-XII intact, no sensory deficits.  Lungs- normal effort no distress noted     Assessment/Plan Mandible fracture- she has no pain and no swelling. Occlusion good. She will stay in wires as she is curently 3 weeks out. Follow up in 2 weeks. Call if wires get loose. Stay on liquid diet  Suzanna Obey 02/24/2023, 10:25 AM

## 2023-02-28 ENCOUNTER — Ambulatory Visit (INDEPENDENT_AMBULATORY_CARE_PROVIDER_SITE_OTHER): Payer: Medicaid Other | Admitting: Otolaryngology

## 2023-03-05 ENCOUNTER — Telehealth: Payer: Self-pay | Admitting: Otolaryngology

## 2023-03-05 ENCOUNTER — Ambulatory Visit (INDEPENDENT_AMBULATORY_CARE_PROVIDER_SITE_OTHER): Payer: Self-pay | Admitting: Otolaryngology

## 2023-03-05 ENCOUNTER — Encounter (INDEPENDENT_AMBULATORY_CARE_PROVIDER_SITE_OTHER): Payer: Self-pay | Admitting: Otolaryngology

## 2023-03-05 VITALS — BP 111/77 | HR 77

## 2023-03-05 DIAGNOSIS — S02652D Fracture of angle of left mandible, subsequent encounter for fracture with routine healing: Secondary | ICD-10-CM

## 2023-03-05 NOTE — Progress Notes (Signed)
Nicole Pratt is an 22 y.o. female.   Chief Complaint: mandible fracture HPI: hx of mandible and now with wire and hardware removal. No pain. Occlusion feels right.   Past Medical History:  Diagnosis Date   GERD (gastroesophageal reflux disease)     Past Surgical History:  Procedure Laterality Date   NO PAST SURGERIES     ORIF MANDIBULAR FRACTURE N/A 02/01/2023   Procedure: MMF;  Surgeon: Suzanna Obey, MD;  Location: [facility] OR;  Service: ENT;  Laterality: N/A;    No family history on file. Social History:  reports that she has been smoking cigarettes. She has been smoking an average of .5 packs per day. She has never used smokeless tobacco. She reports current alcohol use.  Drug: Marijuana.  Allergies: No Known Allergies  (Not in a hospital admission)   No results found for this or any previous visit (from the past 48 hour(s)). No results found.   There were no vitals taken for this visit.  PHYSICAL EXAM: Appearance _ awake alert with no distress.  Head- atraumatic and no obvious abnormalities Eyes- PERRL, EOMI, no conjunctiva injection or ecchymosis Ears-  Right- Pinna without inflammation or swelling. canal without obstruction or injury. TM within normal limits  Left- Pinna without inflammation or swelling. canal without obstruction or injury. TM within normal limits Nose- no lesions or masses.  Oc/OP- wires cut and removed. Arch bars left. Occlusion good no pain.no lesions or excessive swelling. Mouth opening normal.  Hp/Larynx- normal voice and no airway issues. No swelling or lesions Neck- no mass or lesions. Normal movement.  Neuro- CNII-XII intact, no sensory deficits.  Lungs- normal effort no distress noted    Assessment/Plan Mandible fracture- the wires were cut and placed in rubber bands. Shown how to place with patient and family. She is without pain or swelling. Follow up in 2 weeks and soft diet  Suzanna Obey 03/05/2023, 9:34 AM

## 2023-03-05 NOTE — Telephone Encounter (Signed)
scheduled

## 2023-03-05 NOTE — Telephone Encounter (Signed)
Patient was seen 03/05/23 and was told to come back 03/21/23 to have braces removed but Nicole Pratt said Nicole Pratt can't do that day and wanted to know if they can see Dr Jearld Fenton on Monday 03/24/23, Is this okay? And if so what time should I offer?

## 2023-03-05 NOTE — Telephone Encounter (Signed)
Error

## 2023-03-05 NOTE — Telephone Encounter (Signed)
Lvm for patient to call back and schedule °

## 2023-03-21 ENCOUNTER — Ambulatory Visit (INDEPENDENT_AMBULATORY_CARE_PROVIDER_SITE_OTHER): Payer: Medicaid Other | Admitting: Otolaryngology

## 2023-03-24 ENCOUNTER — Ambulatory Visit (INDEPENDENT_AMBULATORY_CARE_PROVIDER_SITE_OTHER): Payer: Self-pay | Admitting: Otolaryngology

## 2023-03-24 DIAGNOSIS — S02652D Fracture of angle of left mandible, subsequent encounter for fracture with routine healing: Secondary | ICD-10-CM

## 2023-03-24 NOTE — Progress Notes (Signed)
Nicole Pratt is an 22 y.o. female.   Chief Complaint: mandible fx HPI: no pain. No malocclusion. Not having any issues.   Past Medical History:  Diagnosis Date   GERD (gastroesophageal reflux disease)     Past Surgical History:  Procedure Laterality Date   NO PAST SURGERIES     ORIF MANDIBULAR FRACTURE N/A 02/01/2023   Procedure: MMF;  Surgeon: Suzanna Obey, MD;  Location: [facility] OR;  Service: ENT;  Laterality: N/A;    No family history on file. Social History:  reports that she has been smoking cigarettes. She has never used smokeless tobacco. She reports current alcohol use.  Drug: Marijuana.  Allergies: No Known Allergies  (Not in a hospital admission)   No results found for this or any previous visit (from the past 48 hour(s)). No results found.    There were no vitals taken for this visit.  PHYSICAL EXAM: Appearance _ awake alert with no distress.  Head- atraumatic and no obvious abnormalities Eyes- PERRL, EOMI, no conjunctiva injection or ecchymosis Ears-  Right- Pinna without inflammation or swelling. canal without obstruction or injury. TM within normal limits  Left- Pinna without inflammation or swelling. canal without obstruction or injury. TM within normal limits Nose- no lesions or masses.  Oc/OP- the mandible seems to have no pain and no swelling. Arch bars in place. Occlusion good.no lesions or excessive swelling. Mouth opening normal.  Hp/Larynx- normal voice and no airway issues. No swelling or lesions Neck- no mass or lesions. Normal movement.  Neuro- CNII-XII intact, no sensory deficits.  Lungs- normal effort no distress noted     Assessment/Plan Mandible fracture- she seems to be healed and no pain. The arch bars were removed after giving her the option of office vs OR. She had to be injected with 1% lidocaine with epi in 2 locations that the plate was stuck to. She tolerated removal well. Stay on soft diet for few days and clean with gargles the  wounds. Follow up as needed  Suzanna Obey 03/24/2023, 9:02 AM

## 2023-03-31 ENCOUNTER — Ambulatory Visit (INDEPENDENT_AMBULATORY_CARE_PROVIDER_SITE_OTHER): Payer: Self-pay | Admitting: Otolaryngology
